# Patient Record
Sex: Female | Born: 2005 | Race: White | Hispanic: Yes | Marital: Single | Smoking: Never smoker
Health system: Southern US, Community
[De-identification: ages and names within clinical notes are randomized; demographics above are authoritative.]

## PROBLEM LIST (undated history)

## (undated) DIAGNOSIS — Z8489 Family history of other specified conditions: Secondary | ICD-10-CM

## (undated) DIAGNOSIS — R519 Headache, unspecified: Secondary | ICD-10-CM

## (undated) DIAGNOSIS — R51 Headache: Secondary | ICD-10-CM

## (undated) HISTORY — DX: Headache, unspecified: R51.9

## (undated) HISTORY — DX: Headache: R51

---
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
## 2024-07-11 ENCOUNTER — Ambulatory Visit
Admission: EM | Admit: 2024-07-11 | Discharge: 2024-07-11 | Disposition: A | Discharge status: Home / Self Care | Attending: Family Medicine | Admitting: Family Medicine

## 2024-07-11 DIAGNOSIS — R21 Rash and other nonspecific skin eruption: Secondary | ICD-10-CM | POA: Diagnosis not present

## 2024-07-11 DIAGNOSIS — L299 Pruritus, unspecified: Secondary | ICD-10-CM | POA: Diagnosis not present

## 2024-07-11 MED ORDER — HYDROXYZINE HCL 25 MG PO TABS: 12.5000 mg | ORAL_TABLET | Freq: Three times a day (TID) | ORAL | 0 refills | Status: AC | PRN

## 2024-07-11 MED ORDER — PREDNISONE 20 MG PO TABS: ORAL_TABLET | ORAL | 0 refills | Status: AC

## 2024-07-11 NOTE — ED Triage Notes (Signed)
 Pt states she woke this am with swelling, redness and itching to face-took zyrtec PTA with no change-states sx have progressed to scalp and ears itching and feeling like a little harder to swallow-NAD-steady gait

## 2024-07-11 NOTE — ED Provider Notes (Signed)
 [facility]  Note:  This document was prepared using Conservation officer, historic buildings and may include unintentional dictation errors.  MRN: [id number] DOB: [birth year]  Subjective:   Jamie Ford is a 18 y.o. female presenting for 1 day history of itching, redness and swelling about her face.  Feels itching in her throat and her ears as well, slight more difficulty swallowing.  No fever, runny or stuffy nose, cough, chest pain, shortness of breath, wheezing, nausea, vomiting, abdominal pain.  Possible inciting factors include a trip to [place] [employer] (had exposure to multiple candles 1 of which she purchased), exposure to another cat besides her own.  Also expresses the she takes a topical medication for acne.  Is not a new medication.  Has used it for some time before but restarted it early August.  No current facility-administered medications for this encounter.  Current Outpatient Medications:    Clindamycin-Benzoyl Per, Refr, gel, Apply 1 Application topically daily., Disp: , Rfl:    RETIN-A 0.05 % cream, Apply 1 Application topically daily., Disp: , Rfl:    Allergies  Allergen Reactions   Shrimp (Diagnostic) Nausea And Vomiting and Swelling   Salmon [Fish Oil] Rash    Past Medical History:  Diagnosis Date   Family history of anesthesia complication 03/19/13   father had n/v wtih anesthesia   Headache      History reviewed. No pertinent surgical history.  Family History  Problem Relation Age of Onset   Heart disease Paternal Grandmother    Migraines Mother     Social History   Tobacco Use   Smoking status: Never   Smokeless tobacco: Never  Vaping Use   Vaping status: Never Used  Substance Use Topics   Alcohol use: No   Drug use: No    ROS   Objective:   Vitals: BP 105/71 (BP Location: Right Arm)   Pulse 74   Temp 98.1 F (36.7 C) (Oral)   Resp 17   LMP 06/14/2024   SpO2 98%   Physical Exam Constitutional:       General: She is not in acute distress.    Appearance: Normal appearance. She is well-developed and normal weight. She is not ill-appearing, toxic-appearing or diaphoretic.  HENT:     Head: Normocephalic and atraumatic.     Right Ear: Tympanic membrane, ear canal and external ear normal. No drainage or tenderness. No middle ear effusion. There is no impacted cerumen. Tympanic membrane is not erythematous or bulging.     Left Ear: Tympanic membrane, ear canal and external ear normal. No drainage or tenderness.  No middle ear effusion. There is no impacted cerumen. Tympanic membrane is not erythematous or bulging.     Nose: Nose normal. No congestion or rhinorrhea.     Mouth/Throat:     Mouth: Mucous membranes are moist. No oral lesions.     Pharynx: No pharyngeal swelling, oropharyngeal exudate, posterior oropharyngeal erythema or uvula swelling.     Tonsils: No tonsillar exudate or tonsillar abscesses. 0 on the right. 0 on the left.  Eyes:     General: Lids are normal. Lids are everted, no foreign bodies appreciated. Vision grossly intact. No scleral icterus.       Right eye: No foreign body, discharge or hordeolum.        Left eye: No foreign body, discharge or hordeolum.     Extraocular Movements: Extraocular movements intact.     Right eye: Normal extraocular  motion.     Left eye: Normal extraocular motion and no nystagmus.     Conjunctiva/sclera: Conjunctivae normal.     Right eye: Right conjunctiva is not injected. No chemosis, exudate or hemorrhage.    Left eye: Left conjunctiva is not injected. No chemosis, exudate or hemorrhage. Cardiovascular:     Rate and Rhythm: Normal rate and regular rhythm.     Heart sounds: Normal heart sounds. No murmur heard.    No friction rub. No gallop.  Pulmonary:     Effort: Pulmonary effort is normal. No respiratory distress.     Breath sounds: No stridor. No wheezing, rhonchi or rales.  Chest:     Chest wall: No tenderness.  Musculoskeletal:      Cervical back: Normal range of motion and neck supple.  Lymphadenopathy:     Cervical: No cervical adenopathy.  Skin:    General: Skin is warm and dry.     Findings: Rash (macular lesions over face, eyes, forehead) present.  Neurological:     General: No focal deficit present.     Mental Status: She is alert and oriented to person, place, and time.  Psychiatric:        Mood and Affect: Mood normal.        Behavior: Behavior normal.      Assessment and Plan :   PDMP not reviewed this encounter.  1. Facial rash   2. Itching    Suspect reaction to the scented candles from the [employer].  Given her facial rash, offered an oral prednisone  course with hydroxyzine .  No signs of anaphylaxis.  Counseled patient on potential for adverse effects with medications prescribed/recommended today, ER and return-to-clinic precautions discussed, patient verbalized understanding.    Christopher Savannah, PA-C 07/11/24 [id number]
